# Patient Record
Sex: Male | Born: 1960 | Marital: Married
Health system: Southern US, Community
[De-identification: ages and names within clinical notes are randomized; demographics above are authoritative.]

---

## 2006-04-02 ENCOUNTER — Ambulatory Visit: Payer: Self-pay | Admitting: Gastroenterology

## 2006-04-08 ENCOUNTER — Ambulatory Visit: Payer: Self-pay | Admitting: Gastroenterology

## 2006-06-21 ENCOUNTER — Emergency Department: Payer: Self-pay | Admitting: Emergency Medicine

## 2007-04-14 ENCOUNTER — Ambulatory Visit: Payer: Self-pay | Admitting: Internal Medicine

## 2009-10-16 ENCOUNTER — Ambulatory Visit: Payer: Self-pay | Admitting: Gastroenterology

## 2011-10-14 ENCOUNTER — Ambulatory Visit: Payer: Self-pay | Admitting: Emergency Medicine

## 2011-10-15 LAB — PATHOLOGY REPORT

## 2013-08-23 ENCOUNTER — Ambulatory Visit: Payer: Self-pay | Admitting: Urology

## 2014-09-18 NOTE — Op Note (Signed)
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   OPERATION: Excision of the large lipoma of the neck.   SURGEON:  M. Cecelia ByarsHashmi, MD  PROCEDURE: The patient was brought to surgery. He was placed with right side neck down.  The mass at the back of the neck was then infiltrated with 1% Xylocaine. An incision was made. He had quite thick skin. After cutting through skin and subcutaneous tissue, the lipoma was not under the subcutaneous tissue. It was way down under the muscle. I opened the fascia up and then took out this lipoma, which was quite large and had fingerlike projections. It was then dissected free. There was some bleeding, which was then stopped with the Bovie. It was just a little ooze from the small vessels. The wound was then closed in layers with 3-0 Vicryl subcutaneously and then 4-0 Vicryl subcuticularly. After this, Steri-Strips were applied. The patient tolerated the procedure well and was sent to the recovery room in satisfactory condition.   ____________________________ Alton RevereMasud S. Cecelia ByarsHashmi, MD [id number] D: 10/14/2011 10:31:10 ET T: 10/14/2011 11:34:01 ET JOB#: [id number]  cc: Trenell Moxey S. Cecelia ByarsHashmi, MD, <Dictator> Marlyn CorporalFayegh H. Jadali, MD Charles ReadyMASUD S Shante Maysonet MD ELECTRONICALLY SIGNED 10/17/2011 8:41

## 2015-05-28 IMAGING — CT CT ABDOMEN AND PELVIS WITHOUT AND WITH CONTRAST
3 of 10 series · 12 of 46 positions shown, 18 images · IV contrast (isovue)
Comparison: US ABDOMEN COMPLETE dated 04/14/2007

CLINICAL DATA: Hematuria.  History of renal stones.

EXAM:
CT ABDOMEN AND PELVIS WITHOUT AND WITH CONTRAST
TECHNIQUE: Multidetector CT imaging of the abdomen and pelvis was performed
without contrast material in one or both body regions, followed by
contrast material(s) and further sections in one or both body
regions.
CONTRAST:  125 mm Isovue 3 7 D

[Series 2: hematuria > 45 wo · axial · 0.89mm/px · z∈[-469,-79]mm · 8 of 102 slices shown, 13 images]
[im 12/102  soft-tissue]
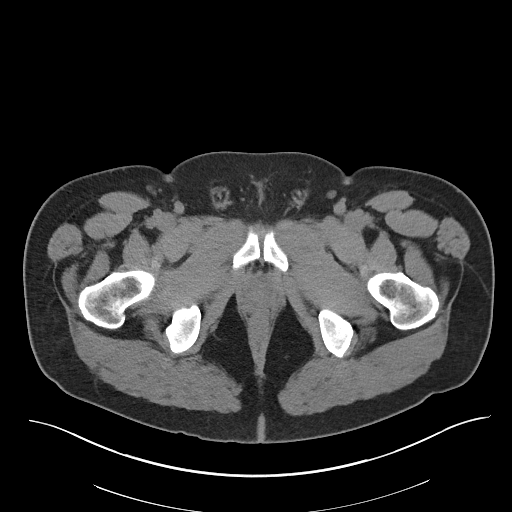
[im 12/102  bone]
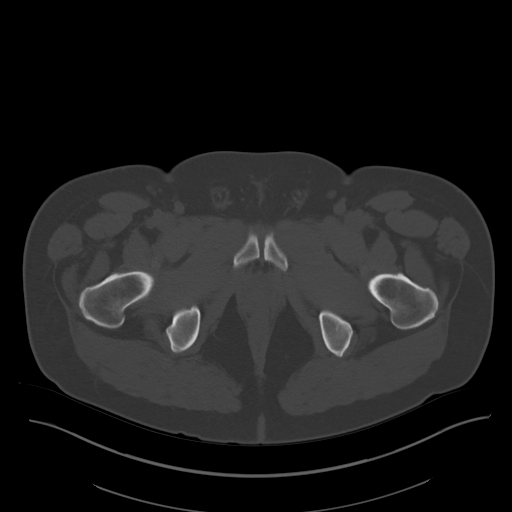
[im 23/102  soft-tissue]
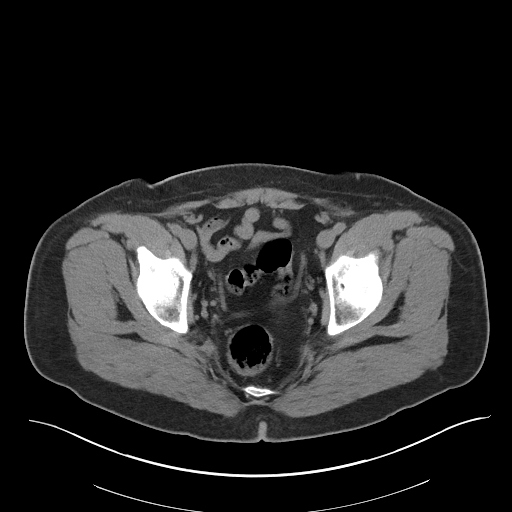
[im 34/102  soft-tissue]
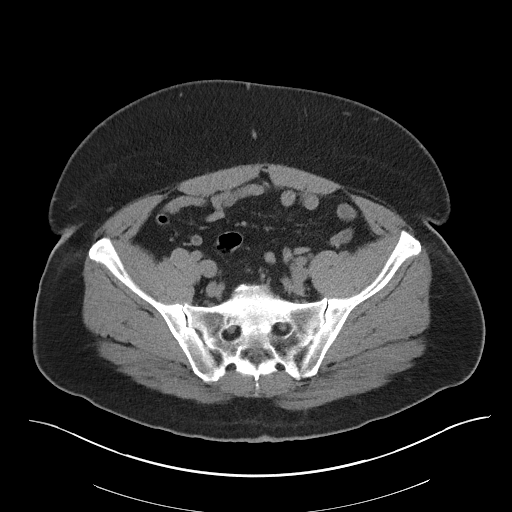
[im 45/102  soft-tissue]
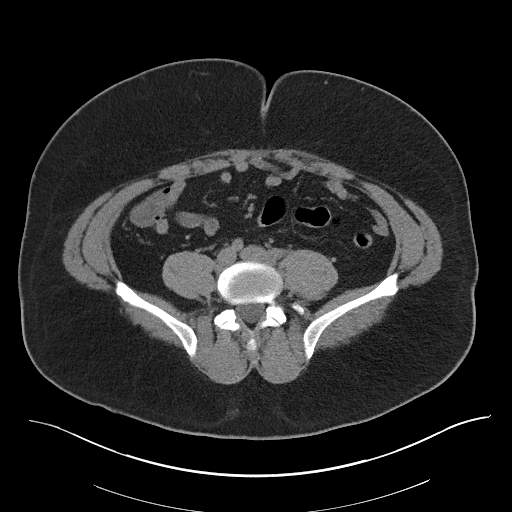
[im 57/102  soft-tissue]
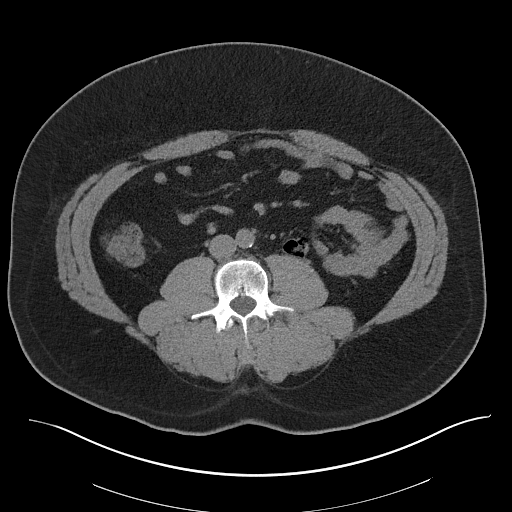
[im 57/102  lung]
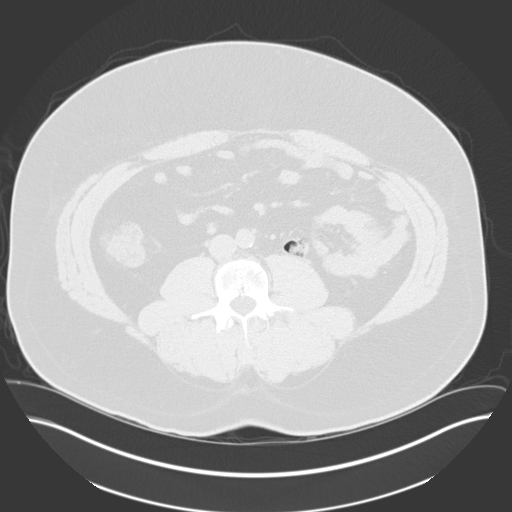
[im 68/102  soft-tissue]
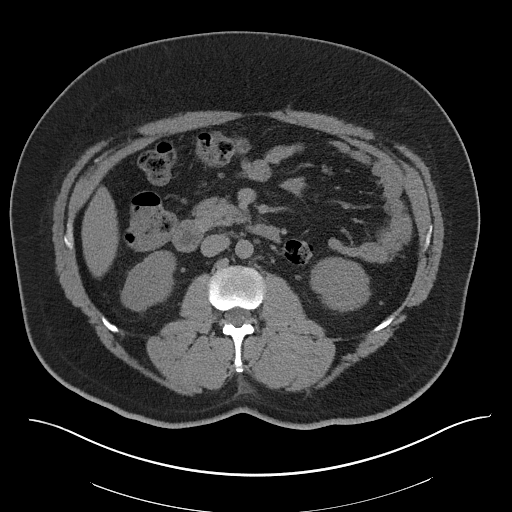
[im 68/102  lung]
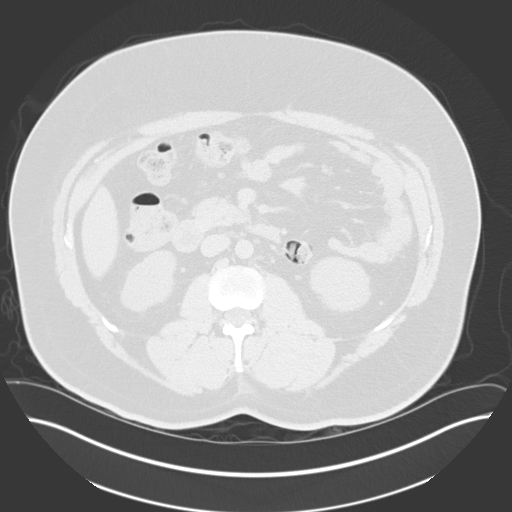
[im 79/102  soft-tissue]
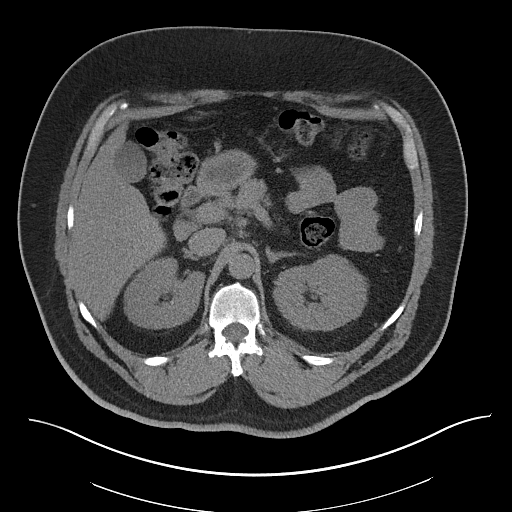
[im 79/102  lung]
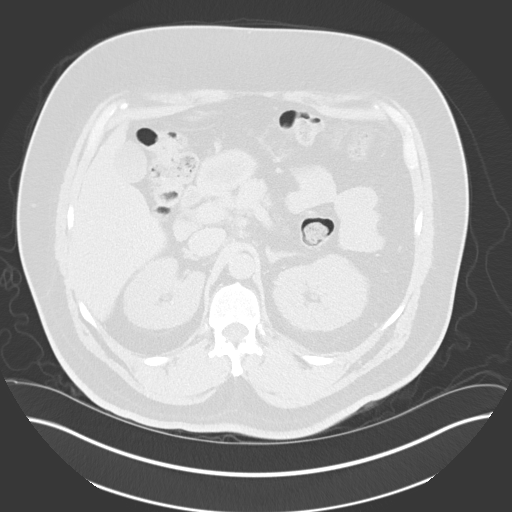
[im 90/102  soft-tissue]
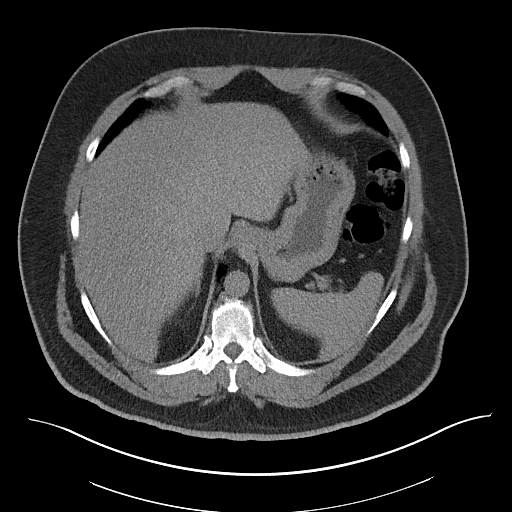
[im 90/102  lung]
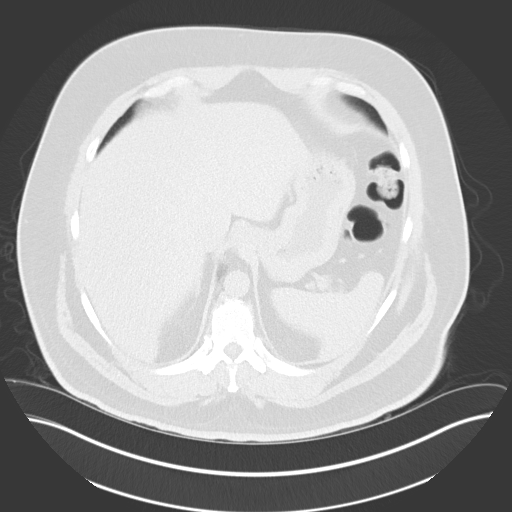

[Series 4: hematuria > 45 with · axial · 0.89mm/px · z∈[-469,-414]mm · 2 of 102 slices shown]
[im 12/102  soft-tissue]
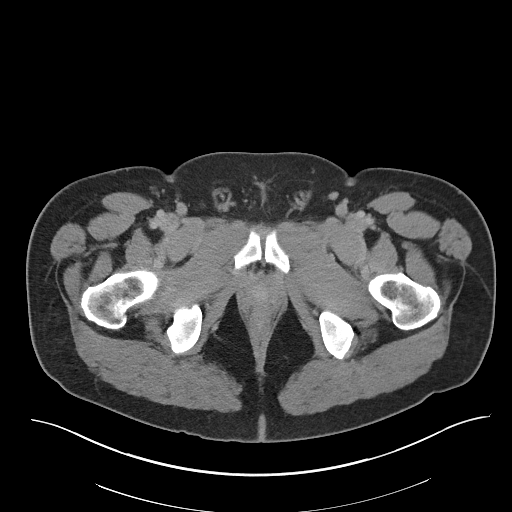
[im 23/102  soft-tissue]
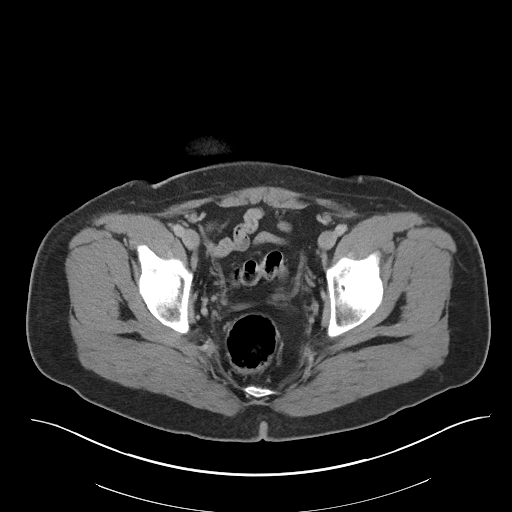

[Series 14: cor delay · coronal · delayed · 0.91mm/px · 2 of 184 slices shown, 3 images]
[im 62/184  soft-tissue]
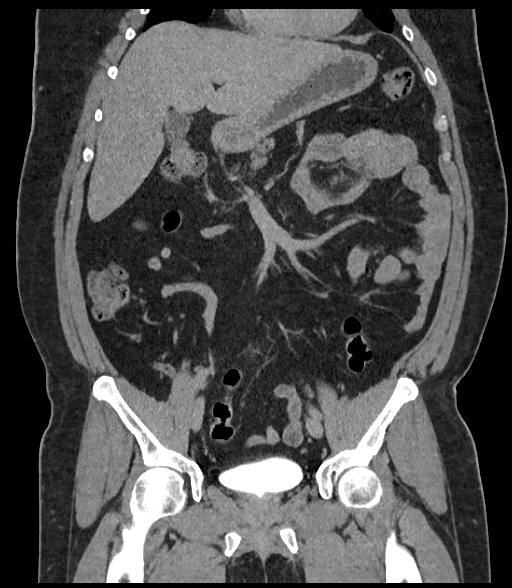
[im 62/184  bone]
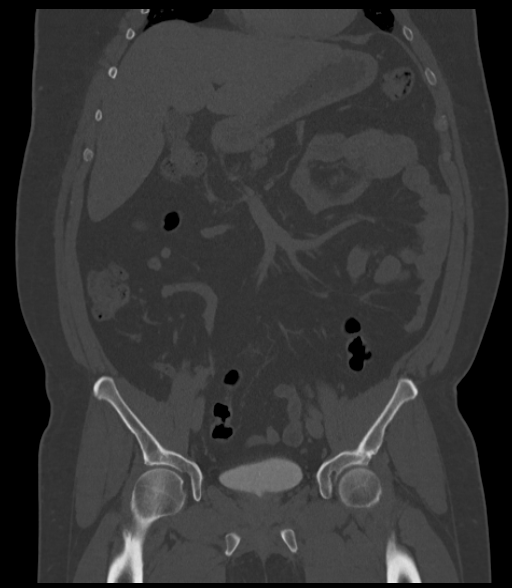
[im 123/184  soft-tissue]
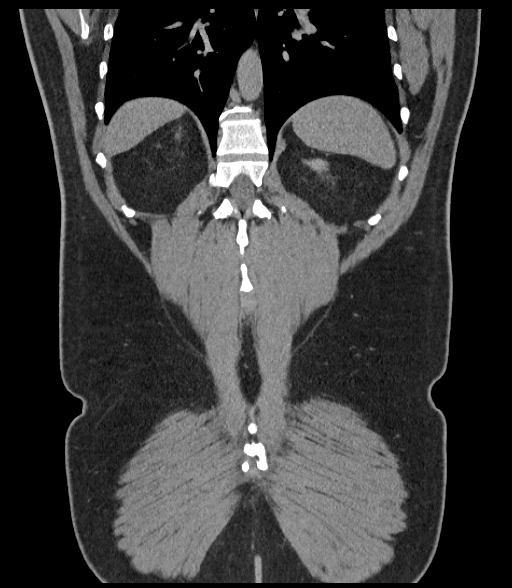

[12 of 46 positions shown; findings below may reference images not displayed]

FINDINGS: Noncontrast images through the bilateral kidneys demonstrate a 6 mm
nonobstructing stone within interpolar region of the left kidney
(image 26; series 2). Additionally there is a punctate 2 mm stone
within the interpolar region of the left kidney (image 116; series
7). No definite stones are visualized within the right kidney. No
ureterolithiasis. No hydronephrosis.

The kidneys enhance symmetrically with contrast. There are bilateral
sub cm low-attenuation lesions which are too small to accurately
characterize with the largest in the superior pole of the right
kidney measuring 8 mm (image 24; series 4).

Delayed phase images demonstrate excretion of contrast within the
bilateral renal collecting systems which are unremarkable in
appearance. The visualized left ureter is unremarkable in
appearance. The majority the right ureter is not opacified,
therefore not evaluated on delayed phase images.

The urinary bladder is somewhat decompressed however there is
suggestion of mild urinary bladder wall thickening near the trigone
(image 114; series 15).

The lung bases demonstrate minimal dependent atelectasis. No pleural
effusion.

The liver is diffusely low in attenuation compatible with hepatic
steatosis. No focal liver lesions are identified. Gallbladder is
unremarkable. No intrahepatic or extrahepatic biliary ductal
dilatation. Portal vein is patent. The spleen, pancreas and
bilateral adrenal glands are unremarkable.

Normal caliber abdominal aorta. No retroperitoneal lymphadenopathy
prostate unremarkable.

No abnormal bowel wall thickening or evidence for bowel obstruction.
Normal appendix. Stomach and duodenum are grossly unremarkable. No
free fluid or free intraperitoneal air.

No aggressive or acute appearing osseous lesions.
IMPRESSION: 1. Two nonobstructing stones within the interpolar region of the
left kidney, largest of which measures 6 mm.
2. Mildly under distended urinary bladder with suggestion of mild
wall thickening near the trigone. Recommend correlation with
cystoscopy.
3. Hepatic steatosis.
4. Bilateral low-attenuation renal lesions, too small to accurately
characterize on current examination.
